# Patient Record
Sex: Male | Born: 1970 | Race: Black or African American | Hispanic: No | Marital: Married | Smoking: Never smoker
Health system: Southern US, Community
[De-identification: ages and names within clinical notes are randomized; demographics above are authoritative.]

## PROBLEM LIST (undated history)

## (undated) DIAGNOSIS — G4730 Sleep apnea, unspecified: Secondary | ICD-10-CM

## (undated) DIAGNOSIS — E119 Type 2 diabetes mellitus without complications: Secondary | ICD-10-CM

## (undated) DIAGNOSIS — I10 Essential (primary) hypertension: Secondary | ICD-10-CM

## (undated) HISTORY — PX: HERNIA REPAIR: SHX51

---

## 2014-09-15 ENCOUNTER — Observation Stay
Admit: 2014-09-15 | Disposition: A | Discharge status: Home / Self Care | Payer: Self-pay | Attending: Internal Medicine | Admitting: Internal Medicine

## 2014-09-15 LAB — CBC WITH DIFFERENTIAL/PLATELET
BASOS ABS: 0 10*3/uL (ref 0.0–0.1)
Basophil %: 0.7 %
Eosinophil #: 0.2 10*3/uL (ref 0.0–0.7)
Eosinophil %: 4.2 %
HCT: 40 % (ref 40.0–52.0)
HGB: 13.6 g/dL (ref 13.0–18.0)
LYMPHS ABS: 2.1 10*3/uL (ref 1.0–3.6)
LYMPHS PCT: 37.9 %
MCH: 30.2 pg (ref 26.0–34.0)
MCHC: 33.8 g/dL (ref 32.0–36.0)
MCV: 89 fL (ref 80–100)
Monocyte #: 0.5 x10 3/mm (ref 0.2–1.0)
Monocyte %: 8.4 %
Neutrophil #: 2.7 10*3/uL (ref 1.4–6.5)
Neutrophil %: 48.8 %
PLATELETS: 218 10*3/uL (ref 150–440)
RBC: 4.49 10*6/uL (ref 4.40–5.90)
RDW: 13.4 % (ref 11.5–14.5)
WBC: 5.5 10*3/uL (ref 3.8–10.6)

## 2014-09-15 LAB — COMPREHENSIVE METABOLIC PANEL
ALBUMIN: 4.6 g/dL
Alkaline Phosphatase: 32 U/L — ABNORMAL LOW
Anion Gap: 5 — ABNORMAL LOW (ref 7–16)
BUN: 19 mg/dL
Bilirubin,Total: 0.3 mg/dL
CHLORIDE: 106 mmol/L
CREATININE: 0.9 mg/dL
Calcium, Total: 8.7 mg/dL — ABNORMAL LOW
Co2: 26 mmol/L
EGFR (African American): >60
EGFR (Non-African Amer.): >60
GLUCOSE: 105 mg/dL — AB
Potassium: 3.3 mmol/L — ABNORMAL LOW
SGOT(AST): 31 U/L
SGPT (ALT): 31 U/L
Sodium: 137 mmol/L
TOTAL PROTEIN: 7.3 g/dL

## 2014-09-15 LAB — CK-MB
CK-MB: 8.4 ng/mL — ABNORMAL HIGH
CK-MB: 8.5 ng/mL — ABNORMAL HIGH

## 2014-09-15 LAB — TROPONIN I

## 2014-09-15 LAB — D-DIMER(ARMC): D-Dimer: 173 ng/ml

## 2014-09-16 LAB — LIPID PANEL
CHOLESTEROL: 112 mg/dL
HDL: 31 mg/dL — AB
Ldl Cholesterol, Calc: 55 mg/dL
Triglycerides: 129 mg/dL
VLDL CHOLESTEROL, CALC: 26 mg/dL

## 2014-09-16 LAB — CK-MB: CK-MB: 8.3 ng/mL — ABNORMAL HIGH

## 2014-09-16 LAB — TROPONIN I

## 2014-09-16 LAB — TSH: Thyroid Stimulating Horm: 3.212 u[IU]/mL

## 2014-09-17 LAB — HEMOGLOBIN A1C: HEMOGLOBIN A1C: 5.8 %

## 2014-10-12 NOTE — Discharge Summary (Signed)
PATIENT NAME:  [name], [name] MR#:  [id number] DATE OF BIRTH:  06-22-1970  DATE OF ADMISSION:  09/15/2014 DATE OF DISCHARGE:  09/16/2014  ADMITTING DIAGNOSIS: Chest pain.   DISCHARGE DIAGNOSES: 1. Chest pain, noncardiac, likely due to elevated blood pressure.  2. Malignant essential hypertension.  3. Hypokalemia.  4. Hyperglycemia with hemoglobin A1c pending.  5. Obesity with body mass index of 34.8.   DISCHARGE CONDITION: Stable.   DISCHARGE MEDICATIONS: The patient is to continue chlorthalidone 25 mg p.o. daily, aspirin 81 mg p.o. daily, nitroglycerin 2% transdermal ointment every 6 hours as needed, lisinopril 5 mg p.o. daily, metoprolol tartrate 25 mg p.o. twice daily. The patient is not to take amlodipine unless recommended by primary care physician.   HOME OXYGEN:  None.   DIET: Low salt, low fat, low cholesterol, regular consistency.   The patient was advised to use less salt as well as lose weight if possible.     FOLLOWUP:   With [facility] in [place] 2 days after discharge.   CONSULTANTS: Care management, social work.   RADIOLOGIC STUDIES: Chest x-ray, portable single view, 09/15/2014, showed no acute cardiopulmonary findings. Myoview stress test 04/052016 showing no evidence of ischemia with normal left ventricular function. No significant wall motion abnormality noted.  Overall low risk scan. Pharmacological myocardial perfusion study with no significant ischemia, estimated ejection fraction of 53%, left ventricular global function was normal. There were no EKG changes concerning for ischemia. There was no artifact noted on this study.   HOSPITAL COURSE:  The patient is a [age] year old African American male with history of hypertension, who presents to the hospital with complaints of chest pain. Please refer to Dr. Suzanne BoronKonidena's admission note on the 09/15/2014. On arrival to the hospital, the patient's blood pressure was elevated at 180s, his temperature was 98.6, pulse  was 72, oxygen saturations were 99% on room air. Heart rate was 73.   Physical exam was unremarkable. The patient's EKG showed T wave inversions in V4, 5,  as well as V6 but no prior EKG to compare with. The patient's lab data done on arrival to the hospital showed elevated glucose level of 105, potassium 3.3, otherwise BMP was normal. The patient's liver enzymes were normal. The patient's cardiac enzymes x3 were within normal limits. The patient's TSH was normal at 3.2. The patient's CBC was within normal limits with white blood cell count 5.5, hemoglobin 13.6, platelet count 216.  Absolute neutrophil count was normal at 2.7. The patient was admitted to the hospital for further evaluation. His cardiac enzymes were cycled and Myoview stress test was ordered.  Myoview stress test was performed on 09/16/2014 and it was unremarkable. It was felt that the patient's chest pain was very likely due to elevated blood pressure readings.  The patient's blood pressure medications were advanced and the patient is being discharged home.  On the day of discharge, the patient's temperature was 97.8, pulse was 58 to 60, respiration was 18, blood pressure fluctuating between 130s to 160s systolic and 90s diastolic. Oxygen saturations were 100% on room air at rest.   TIME SPENT:  Forty minutes.    ____________________________ Katharina Caperima Mirta Mally, MD rv:tr D: 09/16/2014 15:36:57 ET T: 09/16/2014 16:07:47 ET JOB#: [id number]  cc: Katharina Caperima Magdaleno Lortie, MD, <Dictator> [facility] [place] Aveena Bari MD ELECTRONICALLY SIGNED 09/21/2014 16:09

## 2014-10-12 NOTE — H&P (Addendum)
PATIENT NAME:  [name], [name] MR#:  [id number] DATE OF BIRTH:  [birth year]  DATE OF ADMISSION:  09/15/2014  PRIMARY DOCTOR:    EMERGENCY ROOM PHYSICIAN: Dr. Toney Rakes.   CHIEF COMPLAINT: Chest pain.   HISTORY OF PRESENT ILLNESS:  A 44 year old African-American male with obesity, BMI of 34.7, family history of premature coronary artery disease to mom, comes in because of chest pressure. The patient had chest pressure in the midsternal region for about 2 weeks and he went to see his primary doctor today because of his chest pressure. The patient was sent in here because of T wave inversions on EKG and concerning chest pain and EKG changes he was sent here. The patient says that he has chest pain in the middle of the chest for 2 weeks which is constant, no radiation of the pain to the neck or shoulders. No associated trouble breathing or sweating or nausea or dizziness. The patient's chest pain relieved with nitroglycerin given in the Emergency Room. He has no exertional dyspnea. He ran out of his blood pressure medication with chlorthalidone for about a few months. The patient was taking chlorthalidone and Norvasc and he ran out of them, but he restarted Norvasc at 5 mg last week.   PAST MEDICAL HISTORY:  Significant for hypertension only.   FAMILY HISTORY: Significant for premature coronary artery disease, mother died at the age of 56 due to heart attack and also complications from diabetes. Father had irregular heartbeat.   ALLERGIES: No known allergies.   SOCIAL HISTORY: The patient has no smoking, no drinking. Works in [employer].   MEDICATIONS: Norvasc 5 mg daily and aspirin 81 mg daily.   REVIEW OF SYSTEMS:   CONSTITUTIONAL: No fever. No fatigue.  EYES: No blurred vision.  EARS, NOSE, AND THROAT: No tinnitus. No ear pain. No epistaxis.  RESPIRATION:  No cough. No wheezing. CARDIOVASCULAR: Has mid sternal chest pain. No orthopnea, no PND, no palpitations.  GASTROINTESTINAL:  No  nausea. No vomiting. No abdominal pain.  GENITOURINARY: No dysuria.  ENDOCRINE: No polyuria or nocturia.  HEMATOLOGIC: No anemia, easy bruising or bleeding.   INTEGUMENT:  No skin rash.  MUSCULOSKELETAL: No joint pains.  NEUROLOGIC: No numbness or weakness.  PSYCHIATRIC: No anxiety or insomnia.   PHYSICAL EXAMINATION:  VITAL SIGNS: Temperature 98.6, heart rate 72, blood pressure is 139/97 initially, but it went up to 145/104, heart rate 73, saturations 99% on room air.   GENERAL:  This is a well-developed, well-nourished, obese African-American male, not in distress and appropriately answering questions.  HEAD: Atraumatic, normocephalic.  EYES: Pupils equal, reacting to light. No conjunctival pallor. No icterus.  NOSE: No nasal lesions. No drainage.   EARS:  No drainage.  No lesions.   MOUTH:  No lesions. No exudates.  NECK: Supple. Symmetric. No masses. Thyroid in the midline, not enlarged. No JVD. No carotid bruit.  RESPIRATION:  Good respiratory effort.  Clear to auscultation.  CARDIOVASCULAR: Regular rate and rhythm. No murmurs. No gallops. The patient has good femoral pulse, carotid pulse, pedal pulse. No chest wall tenderness. No pedal edema.  GASTROINTESTINAL: Abdomen is soft, nontender, nondistended. Bowel sounds present. MUSCULOSKELETAL: Normal gait and station. No pathology of the digits or nails. EXTREMITIES: Move x 4.  SKIN: Inspection is within normal limits, well hydrated. No diaphoresis. No wounds.   VASCULAR: Good DP and PT pulses.  NEUROLOGIC: Cranial nerves II through XII are intact. Power 5 out of 5 upper and lower extremities.  Sensory intact.  DTRs 2 + bilaterally.  PSYCHIATRIC: Motor and affect are within normal limits.   LABORATORY DATA:  WBC 5.5, hemoglobin 13.6, hematocrit 40, platelets 218,000.  Electrolytes, sodium is 137, potassium 3.3, chloride 106, bicarbonate 26, BUN 19, creatinine 0.90, glucose 105. D-dimer 173. The patient's chest x-ray shows no active  cardiopulmonary disease. EKG, T wave inversions in V4, V5, and V6. No prior EKGs to compare.  ASSESSMENT AND PLAN:  1.  The patient is a 44 year old male with chest pain. The patient has risk factors of obesity, family history of premature coronary artery disease, and hypertension for him. The patient is a high risk candidate for coronary artery disease, so we are going to observe him overnight, check cardiac markers, and also troponins 2 more sets, obtain a Lexiscan stress test in the morning. Continue aspirin, beta blockers, and also nitrates, and obtain fasting lipids.  2.  Hypertension, noncompliant with medications. Restarted, the patient is given beta blockers for blood pressure and nitroglycerin and will monitor on telemetry and see if he needs further medications.  We can add on his Norvasc back.   TIME SPENT:  About 55minutes.   Possibly he should go back on chlorthalidone, if Lexiscan stress test is negative he needs to go back on his chlorthalidone.   ____________________________ Katha HammingSnehalatha Ahyana Skillin, MD sk:bu D: 09/15/2014 19:48:42 ET T: 09/15/2014 20:17:53 ET JOB#: [id number]  cc: Katha HammingSnehalatha Flay Ghosh, MD, <Dictator> Katha HammingSNEHALATHA Kymora Sciara MD ELECTRONICALLY SIGNED 10/14/2014 12:54

## 2016-03-24 IMAGING — CR DG CHEST 1V PORT
1 series · 1 of 1 positions shown · non-contrast
Comparison: None.

CLINICAL DATA: Substernal chest pain on and off for 3 weeks.
Intermittent shortness of breath.

EXAM:
PORTABLE CHEST - 1 VIEW

[ap]
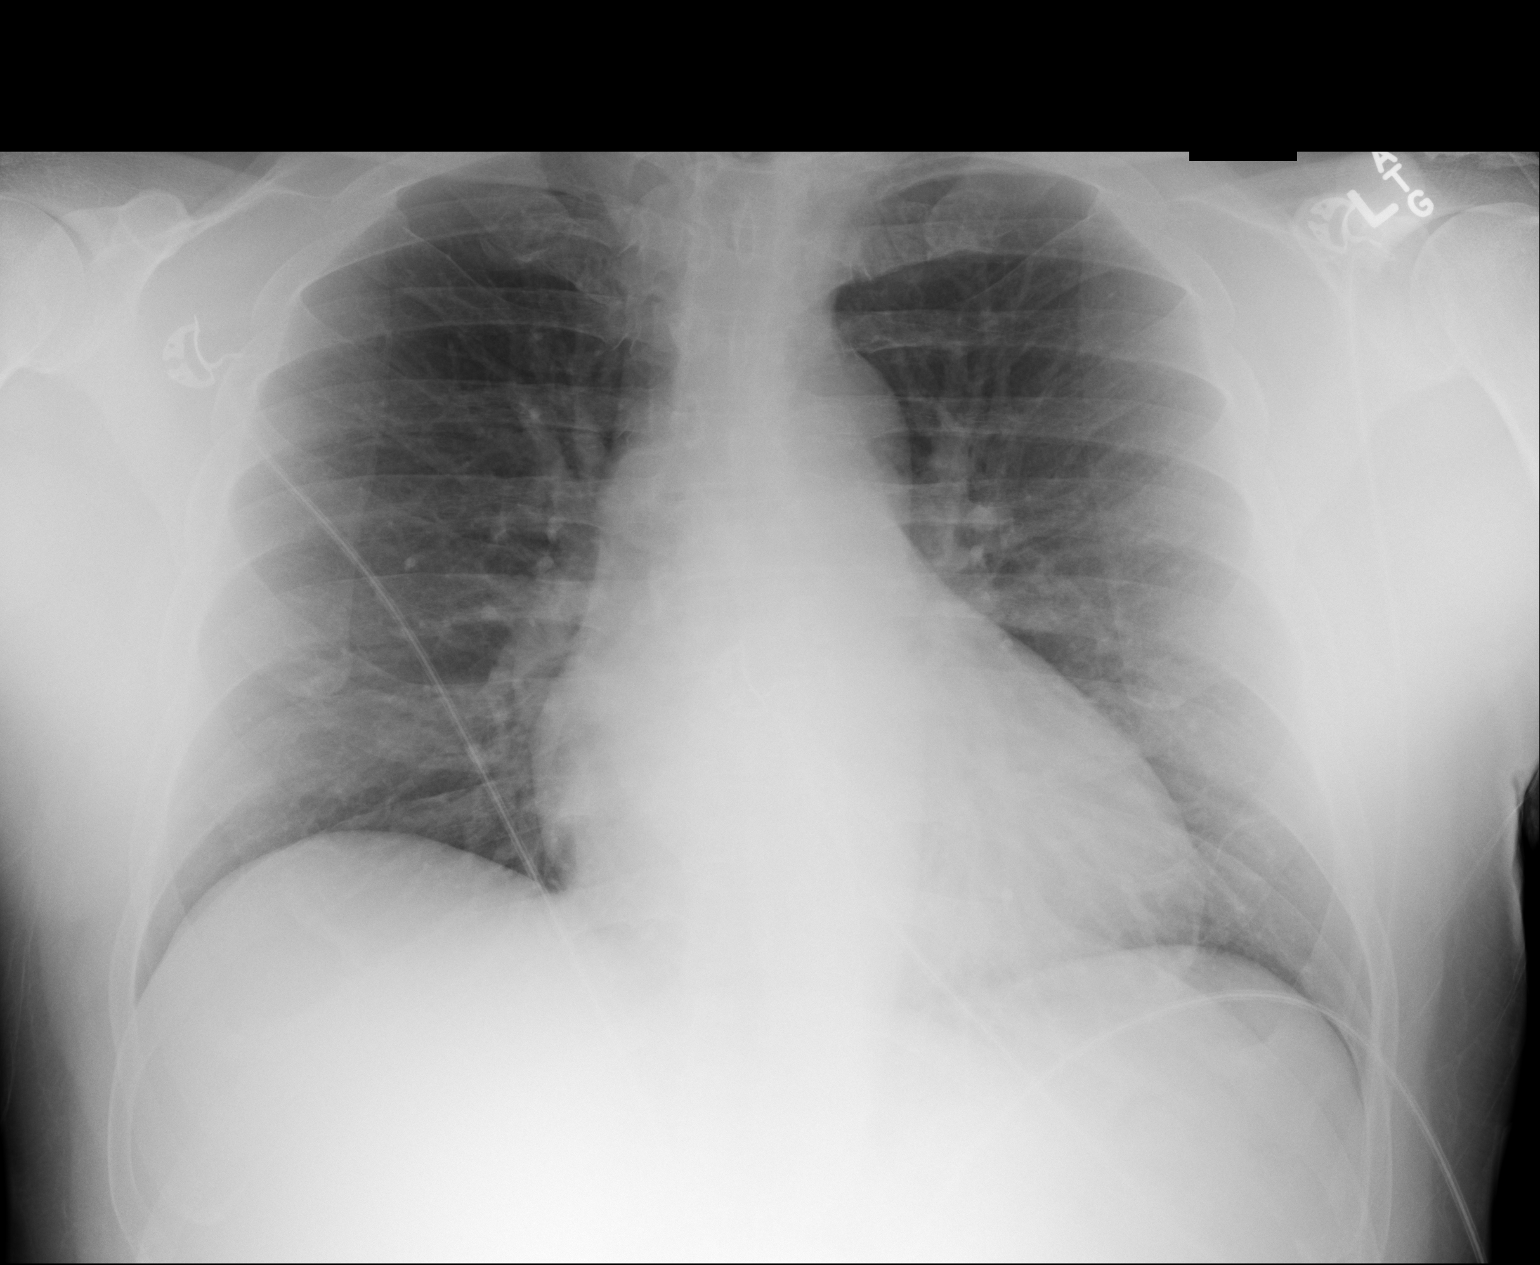

[1 of 1 positions shown; findings below may reference images not displayed]

FINDINGS: The heart is upper limits of normal in size given the AP projection
and portable technique. There is mild tortuosity of the thoracic
aorta. The lungs are clear of acute process. No pleural effusion or
pneumothorax. The bony thorax is intact.
IMPRESSION: No acute cardiopulmonary findings.

## 2017-05-10 ENCOUNTER — Ambulatory Visit
Admission: RE | Admit: 2017-05-10 | Discharge: 2017-05-10 | Disposition: A | Discharge status: Home / Self Care | Payer: Managed Care, Other (non HMO) | Source: Ambulatory Visit | Attending: Internal Medicine | Admitting: Internal Medicine

## 2017-05-10 ENCOUNTER — Encounter
Admission: RE | Disposition: A | Discharge status: Home / Self Care | Payer: Self-pay | Source: Ambulatory Visit | Attending: Internal Medicine

## 2017-05-10 DIAGNOSIS — I4891 Unspecified atrial fibrillation: Secondary | ICD-10-CM | POA: Diagnosis present

## 2017-05-10 DIAGNOSIS — I444 Left anterior fascicular block: Secondary | ICD-10-CM | POA: Insufficient documentation

## 2017-05-10 DIAGNOSIS — E785 Hyperlipidemia, unspecified: Secondary | ICD-10-CM | POA: Diagnosis not present

## 2017-05-10 DIAGNOSIS — Z8249 Family history of ischemic heart disease and other diseases of the circulatory system: Secondary | ICD-10-CM | POA: Diagnosis not present

## 2017-05-10 DIAGNOSIS — Z6835 Body mass index (BMI) 35.0-35.9, adult: Secondary | ICD-10-CM | POA: Diagnosis not present

## 2017-05-10 DIAGNOSIS — E669 Obesity, unspecified: Secondary | ICD-10-CM | POA: Diagnosis not present

## 2017-05-10 DIAGNOSIS — Z538 Procedure and treatment not carried out for other reasons: Secondary | ICD-10-CM | POA: Insufficient documentation

## 2017-05-10 DIAGNOSIS — Z7901 Long term (current) use of anticoagulants: Secondary | ICD-10-CM | POA: Diagnosis not present

## 2017-05-10 DIAGNOSIS — G4730 Sleep apnea, unspecified: Secondary | ICD-10-CM | POA: Insufficient documentation

## 2017-05-10 DIAGNOSIS — Z7982 Long term (current) use of aspirin: Secondary | ICD-10-CM | POA: Insufficient documentation

## 2017-05-10 DIAGNOSIS — Z79899 Other long term (current) drug therapy: Secondary | ICD-10-CM | POA: Insufficient documentation

## 2017-05-10 DIAGNOSIS — I119 Hypertensive heart disease without heart failure: Secondary | ICD-10-CM | POA: Insufficient documentation

## 2017-05-10 DIAGNOSIS — I4892 Unspecified atrial flutter: Secondary | ICD-10-CM | POA: Diagnosis not present

## 2017-05-10 HISTORY — PX: CARDIOVERSION: EP1203

## 2017-05-10 HISTORY — DX: Sleep apnea, unspecified: G47.30

## 2017-05-10 HISTORY — DX: Essential (primary) hypertension: I10

## 2017-05-10 SURGERY — CARDIOVERSION (CATH LAB)
Anesthesia: General

## 2017-05-10 MED ORDER — SODIUM CHLORIDE 0.9 % IV SOLN: INTRAVENOUS | Status: DC

## 2017-05-10 MED ORDER — ROPIVACAINE HCL 2 MG/ML IJ SOLN
INTRAMUSCULAR | Status: AC
  Filled 2017-05-10: qty 10

## 2017-05-10 MED ORDER — FENTANYL CITRATE (PF) 100 MCG/2ML IJ SOLN
INTRAMUSCULAR | Status: AC
  Filled 2017-05-10: qty 2

## 2017-05-10 MED ORDER — DEXAMETHASONE SODIUM PHOSPHATE 10 MG/ML IJ SOLN
INTRAMUSCULAR | Status: AC
  Filled 2017-05-10: qty 1

## 2017-05-10 MED ORDER — LIDOCAINE HCL (PF) 1 % IJ SOLN
INTRAMUSCULAR | Status: AC
  Filled 2017-05-10: qty 5

## 2017-05-11 ENCOUNTER — Encounter: Payer: Self-pay | Admitting: Internal Medicine

## 2017-05-19 NOTE — Op Note (Signed)
Patient didn't have prcedure due to spontaneous conversion to nsr

## 2019-05-20 ENCOUNTER — Other Ambulatory Visit (INDEPENDENT_AMBULATORY_CARE_PROVIDER_SITE_OTHER): Payer: Self-pay | Admitting: Internal Medicine

## 2019-05-20 DIAGNOSIS — I10 Essential (primary) hypertension: Secondary | ICD-10-CM

## 2019-05-21 ENCOUNTER — Ambulatory Visit (INDEPENDENT_AMBULATORY_CARE_PROVIDER_SITE_OTHER): Payer: Managed Care, Other (non HMO)

## 2019-05-21 ENCOUNTER — Other Ambulatory Visit: Payer: Self-pay

## 2019-05-21 DIAGNOSIS — I10 Essential (primary) hypertension: Secondary | ICD-10-CM

## 2023-09-15 ENCOUNTER — Ambulatory Visit
Admission: RE | Admit: 2023-09-15 | Discharge: 2023-09-15 | Disposition: A | Discharge status: Home / Self Care | Source: Ambulatory Visit | Attending: Cardiology | Admitting: Cardiology

## 2023-09-15 ENCOUNTER — Encounter: Payer: Self-pay | Admitting: Cardiology

## 2023-09-15 ENCOUNTER — Ambulatory Visit: Admitting: Anesthesiology

## 2023-09-15 ENCOUNTER — Other Ambulatory Visit: Payer: Self-pay

## 2023-09-15 ENCOUNTER — Encounter
Admission: RE | Disposition: A | Discharge status: Home / Self Care | Payer: Self-pay | Source: Ambulatory Visit | Attending: Cardiology

## 2023-09-15 ENCOUNTER — Ambulatory Visit
Admission: RE | Admit: 2023-09-15 | Discharge: 2023-09-15 | Disposition: A | Discharge status: Home / Self Care | Source: Ambulatory Visit | Attending: Student | Admitting: Student

## 2023-09-15 DIAGNOSIS — Z7901 Long term (current) use of anticoagulants: Secondary | ICD-10-CM | POA: Insufficient documentation

## 2023-09-15 DIAGNOSIS — Z8249 Family history of ischemic heart disease and other diseases of the circulatory system: Secondary | ICD-10-CM | POA: Insufficient documentation

## 2023-09-15 DIAGNOSIS — E119 Type 2 diabetes mellitus without complications: Secondary | ICD-10-CM | POA: Diagnosis not present

## 2023-09-15 DIAGNOSIS — E782 Mixed hyperlipidemia: Secondary | ICD-10-CM | POA: Insufficient documentation

## 2023-09-15 DIAGNOSIS — G4730 Sleep apnea, unspecified: Secondary | ICD-10-CM | POA: Insufficient documentation

## 2023-09-15 DIAGNOSIS — Z7984 Long term (current) use of oral hypoglycemic drugs: Secondary | ICD-10-CM | POA: Diagnosis not present

## 2023-09-15 DIAGNOSIS — I340 Nonrheumatic mitral (valve) insufficiency: Secondary | ICD-10-CM | POA: Diagnosis not present

## 2023-09-15 DIAGNOSIS — I4819 Other persistent atrial fibrillation: Secondary | ICD-10-CM | POA: Diagnosis not present

## 2023-09-15 DIAGNOSIS — I10 Essential (primary) hypertension: Secondary | ICD-10-CM | POA: Insufficient documentation

## 2023-09-15 DIAGNOSIS — Z833 Family history of diabetes mellitus: Secondary | ICD-10-CM | POA: Insufficient documentation

## 2023-09-15 DIAGNOSIS — I4891 Unspecified atrial fibrillation: Secondary | ICD-10-CM | POA: Diagnosis present

## 2023-09-15 HISTORY — PX: CARDIOVERSION: SHX1299

## 2023-09-15 HISTORY — DX: Type 2 diabetes mellitus without complications: E11.9

## 2023-09-15 HISTORY — PX: TEE WITHOUT CARDIOVERSION: SHX5443

## 2023-09-15 LAB — GLUCOSE, CAPILLARY: Glucose-Capillary: 113 mg/dL — ABNORMAL HIGH (ref 70–99)

## 2023-09-15 LAB — ECHO TEE

## 2023-09-15 SURGERY — ECHOCARDIOGRAM, TRANSESOPHAGEAL
Anesthesia: General

## 2023-09-15 MED ORDER — BUTAMBEN-TETRACAINE-BENZOCAINE 2-2-14 % EX AERO
INHALATION_SPRAY | CUTANEOUS | Status: AC
  Filled 2023-09-15: qty 5

## 2023-09-15 MED ORDER — LIDOCAINE HCL (CARDIAC) PF 100 MG/5ML IV SOSY
PREFILLED_SYRINGE | INTRAVENOUS | Status: DC | PRN
  Administered 2023-09-15: 100 mg via INTRAVENOUS

## 2023-09-15 MED ORDER — SODIUM CHLORIDE FLUSH 0.9 % IV SOLN
INTRAVENOUS | Status: AC
Start: 2023-09-15 — End: ?
  Filled 2023-09-15: qty 10

## 2023-09-15 MED ORDER — PROPOFOL 10 MG/ML IV BOLUS
INTRAVENOUS | Status: DC | PRN
  Administered 2023-09-15 (×3): 100 mg via INTRAVENOUS

## 2023-09-15 MED ORDER — LIDOCAINE VISCOUS HCL 2 % MT SOLN
OROMUCOSAL | Status: AC
  Filled 2023-09-15: qty 15

## 2023-09-15 MED ORDER — LIDOCAINE HCL (CARDIAC) PF 100 MG/5ML IV SOSY
PREFILLED_SYRINGE | INTRAVENOUS | Status: AC
  Filled 2023-09-15: qty 5

## 2023-09-15 MED ORDER — SODIUM CHLORIDE 0.9 % IV SOLN: INTRAVENOUS | Status: DC

## 2023-09-15 NOTE — Anesthesia Preprocedure Evaluation (Signed)
 Anesthesia Evaluation  Patient identified by MRN, date of birth, ID band Patient awake    Reviewed: Allergy & Precautions, NPO status , Patient's Chart, lab work & pertinent test results  History of Anesthesia Complications Negative for: history of anesthetic complications  Airway Mallampati: III  TM Distance: >3 FB Neck ROM: full    Dental  (+) Chipped   Pulmonary neg shortness of breath, sleep apnea    Pulmonary exam normal        Cardiovascular Exercise Tolerance: Good hypertension, (-) Past MI + dysrhythmias Atrial Fibrillation  Rhythm:irregular Rate:Tachycardia     Neuro/Psych negative neurological ROS  negative psych ROS   GI/Hepatic negative GI ROS, Neg liver ROS,neg GERD  ,,  Endo/Other  diabetes, Type 2    Renal/GU negative Renal ROS  negative genitourinary   Musculoskeletal   Abdominal   Peds  Hematology negative hematology ROS (+)   Anesthesia Other Findings Past Medical History: No date: Diabetes mellitus without complication (HCC) No date: Hypertension No date: Sleep apnea  Past Surgical History: 05/10/2017: CARDIOVERSION; N/A     Comment:  Procedure: CARDIOVERSION;  Surgeon: Lamar Blinks,               MD;  Location: [facility] ORS;  Service: Cardiovascular;                Laterality: N/A; No date: HERNIA REPAIR     Reproductive/Obstetrics negative OB ROS                             Anesthesia Physical Anesthesia Plan  ASA: 4  Anesthesia Plan: General   Post-op Pain Management:    Induction: Intravenous  PONV Risk Score and Plan: Propofol infusion and TIVA  Airway Management Planned: Natural Airway and Nasal Cannula  Additional Equipment:   Intra-op Plan:   Post-operative Plan:   Informed Consent: I have reviewed the patients History and Physical, chart, labs and discussed the procedure including the risks, benefits and alternatives for the proposed  anesthesia with the patient or authorized representative who has indicated his/her understanding and acceptance.     Dental Advisory Given  Plan Discussed with: Anesthesiologist, CRNA and Surgeon  Anesthesia Plan Comments: (Patient consented for risks of anesthesia including but not limited to:  - adverse reactions to medications - risk of airway placement if required - damage to eyes, teeth, lips or other oral mucosa - nerve damage due to positioning  - sore throat or hoarseness - Damage to heart, brain, nerves, lungs, other parts of body or loss of life  Patient voiced understanding and assent.)       Anesthesia Quick Evaluation

## 2023-09-15 NOTE — Transfer of Care (Signed)
 Immediate Anesthesia Transfer of Care Note  Patient: [name]  Procedure(s) Performed: ECHOCARDIOGRAM, TRANSESOPHAGEAL CARDIOVERSION  Patient Location: PACU and Cath Lab  Anesthesia Type:General  Level of Consciousness: drowsy and patient cooperative  Airway & Oxygen Therapy: Patient Spontanous Breathing and Patient connected to nasal cannula oxygen  Post-op Assessment: Report given to RN and Post -op Vital signs reviewed and stable  Post vital signs: stable on 4L via [place]  Last Vitals:  Vitals Value Taken Time  BP    Temp    Pulse    Resp    SpO2      Last Pain:  Vitals:   09/15/23 1144  TempSrc: Oral  PainSc: 0-No pain         Complications: No notable events documented.

## 2023-09-15 NOTE — Anesthesia Postprocedure Evaluation (Signed)
 Anesthesia Post Note  Patient: [name]  Procedure(s) Performed: ECHOCARDIOGRAM, TRANSESOPHAGEAL CARDIOVERSION  Patient location during evaluation: Specials Recovery Anesthesia Type: General Level of consciousness: awake and alert Pain management: pain level controlled Vital Signs Assessment: post-procedure vital signs reviewed and stable Respiratory status: spontaneous breathing, nonlabored ventilation, respiratory function stable and patient connected to nasal cannula oxygen Cardiovascular status: blood pressure returned to baseline and stable Postop Assessment: no apparent nausea or vomiting Anesthetic complications: no   There were no known notable events for this encounter.   Last Vitals:  Vitals:   09/15/23 1315 09/15/23 1330  BP: 117/80 123/88  Pulse: (!) 59 60  Resp: 15 15  Temp:    SpO2: 98% 98%    Last Pain:  Vitals:   09/15/23 1330  TempSrc:   PainSc: 0-No pain                 Cleda Mccreedy Raynaldo Falco

## 2023-09-15 NOTE — Procedures (Signed)
 Electrical Cardioversion Procedure Note  Indication: Atrial Fibrillation  Procedure Details: Consent: Indication, Risk/benefits of procedure as well as the alternatives explained to patient and informed consent obtained. Time out performed. Verified patient identification, verified procedure, verified correct patient position, special equipment/implants available, medications/allergies/relevent history reviewed, required imaging and test results reviewed.  Deep sedation was provided by anesthesia with propofol. Patient was delivered with 200 Joules of electricity X 1 with success to Sinus rhythm. Patient tolerated the procedure well. No immediate complication noted.   Successful cardioversion  Windell Norfolk, MD [facility]- [facility]

## 2023-09-15 NOTE — Progress Notes (Signed)
*  PRELIMINARY RESULTS* Echocardiogram Echocardiogram Transesophageal has been performed.  Cristela Blue 09/15/2023, 12:28 PM

## 2023-09-18 ENCOUNTER — Encounter: Payer: Self-pay | Admitting: Cardiology

## 2023-10-03 ENCOUNTER — Other Ambulatory Visit: Payer: Self-pay | Admitting: Cardiology

## 2023-10-03 DIAGNOSIS — I480 Paroxysmal atrial fibrillation: Secondary | ICD-10-CM

## 2023-10-03 DIAGNOSIS — E119 Type 2 diabetes mellitus without complications: Secondary | ICD-10-CM

## 2023-10-11 ENCOUNTER — Telehealth (HOSPITAL_COMMUNITY): Payer: Self-pay | Admitting: *Deleted

## 2023-10-11 NOTE — Telephone Encounter (Signed)
Attempted to call patient regarding upcoming cardiac CT appointment. Unable to leave VM (VM not set up). Johney Frame RN Navigator Cardiac Imaging [facility] [phone] Office

## 2023-10-12 ENCOUNTER — Ambulatory Visit
Admission: RE | Admit: 2023-10-12 | Discharge: 2023-10-12 | Disposition: A | Discharge status: Home / Self Care | Source: Ambulatory Visit | Attending: Cardiology | Admitting: Cardiology

## 2023-10-12 DIAGNOSIS — I480 Paroxysmal atrial fibrillation: Secondary | ICD-10-CM | POA: Insufficient documentation

## 2023-10-12 DIAGNOSIS — E119 Type 2 diabetes mellitus without complications: Secondary | ICD-10-CM | POA: Insufficient documentation

## 2023-10-12 LAB — POCT I-STAT CREATININE: Creatinine, Ser: 1.1 mg/dL (ref 0.61–1.24)

## 2023-10-12 MED ORDER — NITROGLYCERIN 0.4 MG SL SUBL
0.8000 mg | SUBLINGUAL_TABLET | Freq: Once | SUBLINGUAL | Status: AC
  Administered 2023-10-12: 0.8 mg via SUBLINGUAL
  Filled 2023-10-12: qty 25

## 2023-10-12 MED ORDER — IOHEXOL 350 MG/ML SOLN
80.0000 mL | Freq: Once | INTRAVENOUS | Status: AC | PRN
  Administered 2023-10-12: 80 mL via INTRAVENOUS

## 2023-10-12 NOTE — Progress Notes (Signed)
Pt tolerated procedure well with no issues. Pt ABCs intact. Pt denies any complaints. Pt encouraged to drink plenty of water throughout the day. Pt ambulatory with steady gait.

## 2024-03-06 ENCOUNTER — Other Ambulatory Visit: Payer: Self-pay | Admitting: Specialist

## 2024-03-06 DIAGNOSIS — R918 Other nonspecific abnormal finding of lung field: Secondary | ICD-10-CM

## 2024-04-10 ENCOUNTER — Ambulatory Visit: Admission: RE | Admit: 2024-04-10 | Source: Ambulatory Visit

## 2024-04-19 ENCOUNTER — Ambulatory Visit
Admission: RE | Admit: 2024-04-19 | Discharge: 2024-04-19 | Disposition: A | Discharge status: Home / Self Care | Source: Ambulatory Visit | Attending: Specialist | Admitting: Specialist

## 2024-04-19 DIAGNOSIS — R918 Other nonspecific abnormal finding of lung field: Secondary | ICD-10-CM | POA: Insufficient documentation
